# Patient Record
Sex: Male | Born: 1985 | Race: White | Hispanic: No | Marital: Married
Health system: Southern US, Community
[De-identification: ages and names within clinical notes are randomized; demographics above are authoritative.]

---

## 2009-04-22 DIAGNOSIS — K589 Irritable bowel syndrome without diarrhea: Secondary | ICD-10-CM | POA: Insufficient documentation

## 2009-04-22 DIAGNOSIS — S52539A Colles' fracture of unspecified radius, initial encounter for closed fracture: Secondary | ICD-10-CM | POA: Insufficient documentation

## 2016-02-04 DIAGNOSIS — G4730 Sleep apnea, unspecified: Secondary | ICD-10-CM | POA: Insufficient documentation

## 2019-06-13 DIAGNOSIS — M9901 Segmental and somatic dysfunction of cervical region: Secondary | ICD-10-CM | POA: Diagnosis not present

## 2019-06-13 DIAGNOSIS — M9903 Segmental and somatic dysfunction of lumbar region: Secondary | ICD-10-CM | POA: Diagnosis not present

## 2019-06-13 DIAGNOSIS — M47813 Spondylosis without myelopathy or radiculopathy, cervicothoracic region: Secondary | ICD-10-CM | POA: Diagnosis not present

## 2019-06-13 DIAGNOSIS — M9904 Segmental and somatic dysfunction of sacral region: Secondary | ICD-10-CM | POA: Diagnosis not present

## 2019-06-17 DIAGNOSIS — M9901 Segmental and somatic dysfunction of cervical region: Secondary | ICD-10-CM | POA: Diagnosis not present

## 2019-06-17 DIAGNOSIS — M9904 Segmental and somatic dysfunction of sacral region: Secondary | ICD-10-CM | POA: Diagnosis not present

## 2019-06-17 DIAGNOSIS — M9903 Segmental and somatic dysfunction of lumbar region: Secondary | ICD-10-CM | POA: Diagnosis not present

## 2019-06-17 DIAGNOSIS — M47813 Spondylosis without myelopathy or radiculopathy, cervicothoracic region: Secondary | ICD-10-CM | POA: Diagnosis not present

## 2019-06-18 DIAGNOSIS — M9901 Segmental and somatic dysfunction of cervical region: Secondary | ICD-10-CM | POA: Diagnosis not present

## 2019-06-18 DIAGNOSIS — M9903 Segmental and somatic dysfunction of lumbar region: Secondary | ICD-10-CM | POA: Diagnosis not present

## 2019-06-18 DIAGNOSIS — M9904 Segmental and somatic dysfunction of sacral region: Secondary | ICD-10-CM | POA: Diagnosis not present

## 2019-06-18 DIAGNOSIS — M47813 Spondylosis without myelopathy or radiculopathy, cervicothoracic region: Secondary | ICD-10-CM | POA: Diagnosis not present

## 2019-06-19 DIAGNOSIS — M9903 Segmental and somatic dysfunction of lumbar region: Secondary | ICD-10-CM | POA: Diagnosis not present

## 2019-06-19 DIAGNOSIS — M9904 Segmental and somatic dysfunction of sacral region: Secondary | ICD-10-CM | POA: Diagnosis not present

## 2019-06-19 DIAGNOSIS — M9901 Segmental and somatic dysfunction of cervical region: Secondary | ICD-10-CM | POA: Diagnosis not present

## 2019-06-19 DIAGNOSIS — M47813 Spondylosis without myelopathy or radiculopathy, cervicothoracic region: Secondary | ICD-10-CM | POA: Diagnosis not present

## 2019-06-24 DIAGNOSIS — M9901 Segmental and somatic dysfunction of cervical region: Secondary | ICD-10-CM | POA: Diagnosis not present

## 2019-06-24 DIAGNOSIS — M9904 Segmental and somatic dysfunction of sacral region: Secondary | ICD-10-CM | POA: Diagnosis not present

## 2019-06-24 DIAGNOSIS — M47813 Spondylosis without myelopathy or radiculopathy, cervicothoracic region: Secondary | ICD-10-CM | POA: Diagnosis not present

## 2019-06-24 DIAGNOSIS — M9903 Segmental and somatic dysfunction of lumbar region: Secondary | ICD-10-CM | POA: Diagnosis not present

## 2019-06-26 DIAGNOSIS — M47813 Spondylosis without myelopathy or radiculopathy, cervicothoracic region: Secondary | ICD-10-CM | POA: Diagnosis not present

## 2019-06-26 DIAGNOSIS — M9903 Segmental and somatic dysfunction of lumbar region: Secondary | ICD-10-CM | POA: Diagnosis not present

## 2019-06-26 DIAGNOSIS — Z23 Encounter for immunization: Secondary | ICD-10-CM | POA: Diagnosis not present

## 2019-06-26 DIAGNOSIS — N451 Epididymitis: Secondary | ICD-10-CM | POA: Diagnosis not present

## 2019-06-26 DIAGNOSIS — M9901 Segmental and somatic dysfunction of cervical region: Secondary | ICD-10-CM | POA: Diagnosis not present

## 2019-06-26 DIAGNOSIS — M9904 Segmental and somatic dysfunction of sacral region: Secondary | ICD-10-CM | POA: Diagnosis not present

## 2019-06-27 DIAGNOSIS — M47813 Spondylosis without myelopathy or radiculopathy, cervicothoracic region: Secondary | ICD-10-CM | POA: Diagnosis not present

## 2019-06-27 DIAGNOSIS — M9901 Segmental and somatic dysfunction of cervical region: Secondary | ICD-10-CM | POA: Diagnosis not present

## 2019-06-27 DIAGNOSIS — M9904 Segmental and somatic dysfunction of sacral region: Secondary | ICD-10-CM | POA: Diagnosis not present

## 2019-06-27 DIAGNOSIS — M9903 Segmental and somatic dysfunction of lumbar region: Secondary | ICD-10-CM | POA: Diagnosis not present

## 2019-07-01 DIAGNOSIS — M9903 Segmental and somatic dysfunction of lumbar region: Secondary | ICD-10-CM | POA: Diagnosis not present

## 2019-07-01 DIAGNOSIS — M47813 Spondylosis without myelopathy or radiculopathy, cervicothoracic region: Secondary | ICD-10-CM | POA: Diagnosis not present

## 2019-07-01 DIAGNOSIS — M9901 Segmental and somatic dysfunction of cervical region: Secondary | ICD-10-CM | POA: Diagnosis not present

## 2019-07-01 DIAGNOSIS — M9904 Segmental and somatic dysfunction of sacral region: Secondary | ICD-10-CM | POA: Diagnosis not present

## 2019-07-03 DIAGNOSIS — M9904 Segmental and somatic dysfunction of sacral region: Secondary | ICD-10-CM | POA: Diagnosis not present

## 2019-07-03 DIAGNOSIS — M47813 Spondylosis without myelopathy or radiculopathy, cervicothoracic region: Secondary | ICD-10-CM | POA: Diagnosis not present

## 2019-07-03 DIAGNOSIS — M9903 Segmental and somatic dysfunction of lumbar region: Secondary | ICD-10-CM | POA: Diagnosis not present

## 2019-07-03 DIAGNOSIS — M9901 Segmental and somatic dysfunction of cervical region: Secondary | ICD-10-CM | POA: Diagnosis not present

## 2019-07-10 DIAGNOSIS — M47813 Spondylosis without myelopathy or radiculopathy, cervicothoracic region: Secondary | ICD-10-CM | POA: Diagnosis not present

## 2019-07-10 DIAGNOSIS — M9903 Segmental and somatic dysfunction of lumbar region: Secondary | ICD-10-CM | POA: Diagnosis not present

## 2019-07-10 DIAGNOSIS — M9904 Segmental and somatic dysfunction of sacral region: Secondary | ICD-10-CM | POA: Diagnosis not present

## 2019-07-10 DIAGNOSIS — M9901 Segmental and somatic dysfunction of cervical region: Secondary | ICD-10-CM | POA: Diagnosis not present

## 2019-07-16 DIAGNOSIS — M9904 Segmental and somatic dysfunction of sacral region: Secondary | ICD-10-CM | POA: Diagnosis not present

## 2019-07-16 DIAGNOSIS — M9903 Segmental and somatic dysfunction of lumbar region: Secondary | ICD-10-CM | POA: Diagnosis not present

## 2019-07-16 DIAGNOSIS — M9901 Segmental and somatic dysfunction of cervical region: Secondary | ICD-10-CM | POA: Diagnosis not present

## 2019-07-16 DIAGNOSIS — M47813 Spondylosis without myelopathy or radiculopathy, cervicothoracic region: Secondary | ICD-10-CM | POA: Diagnosis not present

## 2019-07-23 DIAGNOSIS — M47813 Spondylosis without myelopathy or radiculopathy, cervicothoracic region: Secondary | ICD-10-CM | POA: Diagnosis not present

## 2019-07-23 DIAGNOSIS — M9904 Segmental and somatic dysfunction of sacral region: Secondary | ICD-10-CM | POA: Diagnosis not present

## 2019-07-23 DIAGNOSIS — M9901 Segmental and somatic dysfunction of cervical region: Secondary | ICD-10-CM | POA: Diagnosis not present

## 2019-07-23 DIAGNOSIS — M9903 Segmental and somatic dysfunction of lumbar region: Secondary | ICD-10-CM | POA: Diagnosis not present

## 2019-07-31 DIAGNOSIS — M9903 Segmental and somatic dysfunction of lumbar region: Secondary | ICD-10-CM | POA: Diagnosis not present

## 2019-07-31 DIAGNOSIS — M9904 Segmental and somatic dysfunction of sacral region: Secondary | ICD-10-CM | POA: Diagnosis not present

## 2019-07-31 DIAGNOSIS — M9901 Segmental and somatic dysfunction of cervical region: Secondary | ICD-10-CM | POA: Diagnosis not present

## 2019-07-31 DIAGNOSIS — M47813 Spondylosis without myelopathy or radiculopathy, cervicothoracic region: Secondary | ICD-10-CM | POA: Diagnosis not present

## 2019-08-29 DIAGNOSIS — M9904 Segmental and somatic dysfunction of sacral region: Secondary | ICD-10-CM | POA: Diagnosis not present

## 2019-08-29 DIAGNOSIS — M9903 Segmental and somatic dysfunction of lumbar region: Secondary | ICD-10-CM | POA: Diagnosis not present

## 2019-08-29 DIAGNOSIS — M47813 Spondylosis without myelopathy or radiculopathy, cervicothoracic region: Secondary | ICD-10-CM | POA: Diagnosis not present

## 2019-08-29 DIAGNOSIS — M9901 Segmental and somatic dysfunction of cervical region: Secondary | ICD-10-CM | POA: Diagnosis not present

## 2019-09-11 DIAGNOSIS — Z20828 Contact with and (suspected) exposure to other viral communicable diseases: Secondary | ICD-10-CM | POA: Diagnosis not present

## 2019-10-14 DIAGNOSIS — S8011XA Contusion of right lower leg, initial encounter: Secondary | ICD-10-CM | POA: Diagnosis not present

## 2020-01-11 DIAGNOSIS — J45998 Other asthma: Secondary | ICD-10-CM | POA: Diagnosis not present

## 2020-01-13 DIAGNOSIS — Z1152 Encounter for screening for COVID-19: Secondary | ICD-10-CM | POA: Diagnosis not present

## 2020-01-13 DIAGNOSIS — R0600 Dyspnea, unspecified: Secondary | ICD-10-CM | POA: Diagnosis not present

## 2020-01-13 DIAGNOSIS — J302 Other seasonal allergic rhinitis: Secondary | ICD-10-CM | POA: Diagnosis not present

## 2020-02-05 DIAGNOSIS — Z6836 Body mass index (BMI) 36.0-36.9, adult: Secondary | ICD-10-CM | POA: Diagnosis not present

## 2020-02-05 DIAGNOSIS — Z13220 Encounter for screening for lipoid disorders: Secondary | ICD-10-CM | POA: Diagnosis not present

## 2020-02-05 DIAGNOSIS — Z0000 Encounter for general adult medical examination without abnormal findings: Secondary | ICD-10-CM | POA: Diagnosis not present

## 2020-04-28 DIAGNOSIS — N50811 Right testicular pain: Secondary | ICD-10-CM | POA: Diagnosis not present

## 2020-05-07 DIAGNOSIS — R1031 Right lower quadrant pain: Secondary | ICD-10-CM | POA: Diagnosis not present

## 2020-05-07 DIAGNOSIS — I861 Scrotal varices: Secondary | ICD-10-CM | POA: Diagnosis not present

## 2020-05-07 DIAGNOSIS — N503 Cyst of epididymis: Secondary | ICD-10-CM | POA: Diagnosis not present

## 2020-05-20 DIAGNOSIS — M9903 Segmental and somatic dysfunction of lumbar region: Secondary | ICD-10-CM | POA: Diagnosis not present

## 2020-05-20 DIAGNOSIS — M9904 Segmental and somatic dysfunction of sacral region: Secondary | ICD-10-CM | POA: Diagnosis not present

## 2020-05-20 DIAGNOSIS — M9901 Segmental and somatic dysfunction of cervical region: Secondary | ICD-10-CM | POA: Diagnosis not present

## 2020-05-20 DIAGNOSIS — M47813 Spondylosis without myelopathy or radiculopathy, cervicothoracic region: Secondary | ICD-10-CM | POA: Diagnosis not present

## 2020-05-21 DIAGNOSIS — M47813 Spondylosis without myelopathy or radiculopathy, cervicothoracic region: Secondary | ICD-10-CM | POA: Diagnosis not present

## 2020-05-21 DIAGNOSIS — M9901 Segmental and somatic dysfunction of cervical region: Secondary | ICD-10-CM | POA: Diagnosis not present

## 2020-05-21 DIAGNOSIS — M9903 Segmental and somatic dysfunction of lumbar region: Secondary | ICD-10-CM | POA: Diagnosis not present

## 2020-05-21 DIAGNOSIS — M9904 Segmental and somatic dysfunction of sacral region: Secondary | ICD-10-CM | POA: Diagnosis not present

## 2020-06-23 DIAGNOSIS — J069 Acute upper respiratory infection, unspecified: Secondary | ICD-10-CM | POA: Diagnosis not present

## 2020-06-23 DIAGNOSIS — Z20822 Contact with and (suspected) exposure to covid-19: Secondary | ICD-10-CM | POA: Diagnosis not present

## 2020-08-12 DIAGNOSIS — U071 COVID-19: Secondary | ICD-10-CM | POA: Diagnosis not present

## 2020-08-14 DIAGNOSIS — U071 COVID-19: Secondary | ICD-10-CM | POA: Diagnosis not present

## 2020-08-14 DIAGNOSIS — B309 Viral conjunctivitis, unspecified: Secondary | ICD-10-CM | POA: Diagnosis not present

## 2020-08-15 DIAGNOSIS — U071 COVID-19: Secondary | ICD-10-CM | POA: Insufficient documentation

## 2020-08-17 DIAGNOSIS — U071 COVID-19: Secondary | ICD-10-CM | POA: Diagnosis not present

## 2020-08-17 DIAGNOSIS — Z23 Encounter for immunization: Secondary | ICD-10-CM | POA: Diagnosis not present

## 2020-12-22 DIAGNOSIS — Z6835 Body mass index (BMI) 35.0-35.9, adult: Secondary | ICD-10-CM | POA: Diagnosis not present

## 2020-12-22 DIAGNOSIS — Z0000 Encounter for general adult medical examination without abnormal findings: Secondary | ICD-10-CM | POA: Diagnosis not present

## 2020-12-22 DIAGNOSIS — G4733 Obstructive sleep apnea (adult) (pediatric): Secondary | ICD-10-CM | POA: Diagnosis not present

## 2020-12-22 DIAGNOSIS — E785 Hyperlipidemia, unspecified: Secondary | ICD-10-CM | POA: Diagnosis not present

## 2021-01-20 DIAGNOSIS — G4733 Obstructive sleep apnea (adult) (pediatric): Secondary | ICD-10-CM | POA: Diagnosis not present

## 2021-01-20 DIAGNOSIS — Z9989 Dependence on other enabling machines and devices: Secondary | ICD-10-CM | POA: Diagnosis not present

## 2021-01-20 DIAGNOSIS — G4710 Hypersomnia, unspecified: Secondary | ICD-10-CM | POA: Diagnosis not present

## 2021-01-20 DIAGNOSIS — R0683 Snoring: Secondary | ICD-10-CM | POA: Diagnosis not present

## 2021-03-23 DIAGNOSIS — H5711 Ocular pain, right eye: Secondary | ICD-10-CM | POA: Diagnosis not present

## 2021-03-23 DIAGNOSIS — H47323 Drusen of optic disc, bilateral: Secondary | ICD-10-CM | POA: Diagnosis not present

## 2021-03-23 DIAGNOSIS — H0102B Squamous blepharitis left eye, upper and lower eyelids: Secondary | ICD-10-CM | POA: Diagnosis not present

## 2021-03-23 DIAGNOSIS — H0102A Squamous blepharitis right eye, upper and lower eyelids: Secondary | ICD-10-CM | POA: Diagnosis not present

## 2021-05-13 DIAGNOSIS — G4733 Obstructive sleep apnea (adult) (pediatric): Secondary | ICD-10-CM | POA: Diagnosis not present

## 2021-05-25 ENCOUNTER — Ambulatory Visit: Payer: BC Managed Care – PPO | Admitting: Sports Medicine

## 2021-05-25 ENCOUNTER — Other Ambulatory Visit: Payer: Self-pay

## 2021-05-25 ENCOUNTER — Encounter: Payer: Self-pay | Admitting: Sports Medicine

## 2021-05-25 ENCOUNTER — Ambulatory Visit (INDEPENDENT_AMBULATORY_CARE_PROVIDER_SITE_OTHER): Payer: BC Managed Care – PPO

## 2021-05-25 ENCOUNTER — Telehealth: Payer: Self-pay | Admitting: Sports Medicine

## 2021-05-25 DIAGNOSIS — M1711 Unilateral primary osteoarthritis, right knee: Secondary | ICD-10-CM | POA: Diagnosis not present

## 2021-05-25 DIAGNOSIS — M1712 Unilateral primary osteoarthritis, left knee: Secondary | ICD-10-CM | POA: Diagnosis not present

## 2021-05-25 DIAGNOSIS — M25561 Pain in right knee: Secondary | ICD-10-CM

## 2021-05-25 DIAGNOSIS — M25562 Pain in left knee: Secondary | ICD-10-CM | POA: Diagnosis not present

## 2021-05-25 MED ORDER — NAPROXEN 500 MG PO TABS: 500.0000 mg | ORAL_TABLET | Freq: Two times a day (BID) | ORAL | 3 refills | Status: AC

## 2021-05-25 NOTE — Assessment & Plan Note (Signed)
Brandon Newman returns, he is a pleasant 35 year old male, he has had a long history of problems with his right knee, he had a meniscal repair later in high school, this was followed by a partial meniscectomy somewhat later after a retear. Since then he said increasing pain medial joint line, he did have what sounds to be a Synvisc 1 injection with an outside orthopedic group that seem to work for several years. Historically steroid injections have not provided significant relief. I think we should do the tried and true method, we will switch him to naproxen 500 twice daily, get some updated x-rays, and I will get him approved for viscosupplementation, ideally Orthovisc but we can do Synvisc if that is what his insurance company would like. Return to see me to start the injection series.

## 2021-05-25 NOTE — Telephone Encounter (Signed)
Brandon Newman has right knee osteoarthritis, x-ray confirmed, he did well with the Synvisc 1 about 2 years ago, recurrence of pain, failed NSAIDs, steroid injections have not worked, lets get approval again for Orthovisc or Synvisc whichever one his insurance company would like to pay for.

## 2021-05-25 NOTE — Progress Notes (Signed)
    Procedures performed today:    None.  Independent interpretation of notes and tests performed by another provider:   None.  Brief History, Exam, Impression, and Recommendations:    Primary osteoarthritis of right knee Brandon Newman returns, he is a pleasant 35 year old male, he has had a long history of problems with his right knee, he had a meniscal repair later in high school, this was followed by a partial meniscectomy somewhat later after a retear. Since then he said increasing pain medial joint line, he did have what sounds to be a Synvisc 1 injection with an outside orthopedic group that seem to work for several years. Historically steroid injections have not provided significant relief. I think we should do the tried and true method, we will switch him to naproxen 500 twice daily, get some updated x-rays, and I will get him approved for viscosupplementation, ideally Orthovisc but we can do Synvisc if that is what his insurance company would like. Return to see me to start the injection series.  Chronic pain with exacerbation and pharmacologic intervention.  ___________________________________________ Ihor Austin. Benjamin Stain, M.D., ABFM., CAQSM. Primary Care and Sports Medicine [facility]  Adjunct Instructor of Family Medicine  [employer]

## 2021-06-11 NOTE — Telephone Encounter (Signed)
PA information submitted via [employer] Paperwork has been printed and given to Dr. T for signatures. Once obtained, information will be faxed to [employer] at [phone]  

## 2021-06-12 DIAGNOSIS — G4733 Obstructive sleep apnea (adult) (pediatric): Secondary | ICD-10-CM | POA: Diagnosis not present

## 2021-06-16 NOTE — Telephone Encounter (Signed)
[employer] paperwork signed by Dr. T and faxed to [employer] at [phone] Fax confirmation receipt received 

## 2021-06-23 NOTE — Telephone Encounter (Addendum)
Benefits Investigation Details received from [place] with [employer] [place] at [place] to make sure I was understanding the details appropriately  PA submitted to [facility] via [place] is [place] [phone]) PA pending  Pt has met $311.39 of his $3500 deductible Once PA has been obtained, he will pay completely out-of-pocket for injections. The $35 copay will not apply.

## 2021-06-30 NOTE — Telephone Encounter (Signed)
Spoke with [place] with [employer] and was told the PA is still pending. She said that a determination is supposed to be done by 07/01/21.

## 2021-07-01 NOTE — Telephone Encounter (Signed)
Offer him PRP injections, they are $475 each, we draw his own blood for the procedure, generally we will do 2 or 3.  He would need to be off of anti-inflammatories for a week prior to PRP injections.

## 2021-07-01 NOTE — Telephone Encounter (Signed)
PA was denied by [employer]:  "This medication is approved when the member uses fewer pain medications (such as ibuprofen, naproxen, acetaminophen, etc) or a lower dose of pain medications since the last knee injection. Also, this medication is approved when the member has less pain and has significant improvement in activities of daily living since the last knee injection. Medical records are required to show the member is using less pain medication and to show the member has had improvement. In this case, the member has not used less pain medications or a lower dose of pain medication and the member has not had significant improvement in pain or activities since the last knee injection"  Would you like to proceed with an appeal or refer the pt to the [employer] Direct Access discount program?

## 2021-07-06 NOTE — Telephone Encounter (Signed)
Patient aware of recommendation via voicemail. Instructed patient to call back if there are any questions or concerns.

## 2021-07-06 NOTE — Telephone Encounter (Signed)
At this point I think continue current treatment plan.

## 2021-07-06 NOTE — Telephone Encounter (Signed)
Spoke with pt and he said that right now he is not able to afford doing PRP injections. Are there any other options, such as the [employer] Access discount program, or should the pt continue with the current treatment regimen?

## 2021-07-13 DIAGNOSIS — G4733 Obstructive sleep apnea (adult) (pediatric): Secondary | ICD-10-CM | POA: Diagnosis not present

## 2021-07-16 NOTE — Telephone Encounter (Signed)
Pt called back and said that he spoke with his insurance company to find out the reason for the denial. He said that according to what they told him, we answered some of the questions wrong, the ones regarding decreasing use of pain meds and whether or not he had improvement after previous injections. I told him that I filled out all of the answers according to the information I had in his chart and submitted office notes for them to review. He requested the PA to be re-submitted. I told him I would be happy to do that for him, however that will not change the payment requirements for the injections.   Info and medical records faxed to [facility] of  for review.

## 2021-08-20 NOTE — Telephone Encounter (Signed)
Thank you, I made some changes to the letter and it should be available in epic to send off, my signature is on it as well.

## 2021-08-20 NOTE — Telephone Encounter (Signed)
Pt called checking on the status of the Synvisc injections. I called [employer] and they stated they did not receive the faxed medical records I sent even though I got a fax confirmation back. Inetta Fermo with [employer] said that what needed to be done at this point is send in a PCR request. This would include a letter stating that the patient has had some improvement with the injections and that he does not have to take as much pain meds/NSAID's for pain control. She said that the original submission did not show that there was improvement and decrease in pain meds so this needs to be stated in the letter. Once the letter is complete it has to be faxed along with medical records to them at [phone]. Letter has been drafted and sent to Dr. Karie Schwalbe for review and signing.

## 2021-08-23 NOTE — Telephone Encounter (Signed)
Letter, office notes, and imaging reports printed and faxed to [facility]. Fax confirmation received. Pt aware of status via voicemail.

## 2021-09-21 NOTE — Telephone Encounter (Signed)
Pt has [employer] and as of 09/12/2021, they now cover Orthovisc.  [employer] paperwork faxed to [employer] at [phone] Request is for Orthovisc Pt's insurance prefers Orthovisc Fax confirmation receipt received   Pt aware of PA status. Per Dr. Karie Schwalbe, once PA has been started we can do buy and bill for Orthovisc.

## 2021-09-22 NOTE — Telephone Encounter (Signed)
Spoke with Dr. T and he instructed me to go ahead and schedule the pt for buy and bill. Pt aware and call transferred to front desk for scheduling. Will continue to get PA process done °

## 2021-09-27 ENCOUNTER — Other Ambulatory Visit: Payer: Self-pay

## 2021-09-27 ENCOUNTER — Ambulatory Visit: Payer: BC Managed Care – PPO | Admitting: Medical-Surgical

## 2021-09-27 ENCOUNTER — Ambulatory Visit: Payer: BC Managed Care – PPO | Admitting: Sports Medicine

## 2021-09-27 ENCOUNTER — Ambulatory Visit (INDEPENDENT_AMBULATORY_CARE_PROVIDER_SITE_OTHER): Payer: BC Managed Care – PPO

## 2021-09-27 DIAGNOSIS — M1711 Unilateral primary osteoarthritis, right knee: Secondary | ICD-10-CM | POA: Diagnosis not present

## 2021-09-27 NOTE — Assessment & Plan Note (Addendum)
Brandon Newman returns, pleasant 36 year old male, long history of right knee pain, history of meniscal repair followed by partial meniscectomy somewhat later. Had Synvisc 1 in the past at an outside clinic that worked well, we recently got him approved for Orthovisc, starting #1 today. Return in 1 week for Orthovisc No. 2 of 4 right knee.

## 2021-09-27 NOTE — Progress Notes (Signed)
° ° °  Procedures performed today:    Procedure: Real-time Ultrasound Guided aspiration/injection of the right knee Device: Samsung HS60  Verbal informed consent obtained.  Time-out conducted.  Noted no overlying erythema, induration, or other signs of local infection.  Skin prepped in a sterile fashion.  Local anesthesia: Topical Ethyl chloride.  With sterile technique and under real time ultrasound guidance: Noted trace effusion, aspirated 35 mL of clear, straw-colored fluid, syringe switched and 30 mg/2 mL of OrthoVisc (sodium hyaluronate) in a prefilled syringe was injected easily into the knee through a 22-gauge needle.   Completed without difficulty  Advised to call if fevers/chills, erythema, induration, drainage, or persistent bleeding.  Images permanently stored and available for review in PACS.  Impression: Technically successful ultrasound guided aspiration/injection.  Independent interpretation of notes and tests performed by another provider:   None.  Brief History, Exam, Impression, and Recommendations:    Primary osteoarthritis of right knee Brandon Newman returns, pleasant 36 year old male, long history of right knee pain, history of meniscal repair followed by partial meniscectomy somewhat later. Had Synvisc 1 in the past at an outside clinic that worked well, we recently got him approved for Orthovisc, starting #1 today. Return in 1 week for Orthovisc No. 2 of 4 right knee.    ___________________________________________ Ihor Austin. Benjamin Stain, M.D., ABFM., CAQSM. Primary Care and Sports Medicine [facility]  Adjunct Instructor of Family Medicine  [employer]

## 2021-10-04 ENCOUNTER — Ambulatory Visit: Payer: BC Managed Care – PPO | Admitting: Sports Medicine

## 2021-10-04 ENCOUNTER — Other Ambulatory Visit: Payer: Self-pay

## 2021-10-04 ENCOUNTER — Ambulatory Visit (INDEPENDENT_AMBULATORY_CARE_PROVIDER_SITE_OTHER): Payer: BC Managed Care – PPO

## 2021-10-04 DIAGNOSIS — M1711 Unilateral primary osteoarthritis, right knee: Secondary | ICD-10-CM | POA: Diagnosis not present

## 2021-10-04 NOTE — Progress Notes (Signed)
° ° °  Procedures performed today:    Procedure: Real-time Ultrasound Guided aspiration/injection of the right knee Device: Samsung HS60  Verbal informed consent obtained.  Time-out conducted.  Noted no overlying erythema, induration, or other signs of local infection.  Skin prepped in a sterile fashion.  Local anesthesia: Topical Ethyl chloride.  With sterile technique and under real time ultrasound guidance: Noted trace effusion, aspirated 20 mL of clear, straw-colored fluid, syringe switched and 30 mg/2 mL of OrthoVisc (sodium hyaluronate) in a prefilled syringe was injected easily into the knee through a 22-gauge needle.   Completed without difficulty  Advised to call if fevers/chills, erythema, induration, drainage, or persistent bleeding.  Images permanently stored and available for review in PACS.  Impression: Technically successful ultrasound guided aspiration/injection.  Independent interpretation of notes and tests performed by another provider:   None.  Brief History, Exam, Impression, and Recommendations:    Primary osteoarthritis of right knee Brandon Newman returns, he is here for Orthovisc No. 2 of 4. Return in 1 week for #3 of 4 right knee.    ___________________________________________ Ihor Austin. Benjamin Stain, M.D., ABFM., CAQSM. Primary Care and Sports Medicine [facility]  Adjunct Instructor of Family Medicine  [employer]

## 2021-10-04 NOTE — Assessment & Plan Note (Signed)
Josemiguel returns, he is here for Orthovisc No. 2 of 4. Return in 1 week for #3 of 4 right knee.

## 2021-10-11 ENCOUNTER — Ambulatory Visit (INDEPENDENT_AMBULATORY_CARE_PROVIDER_SITE_OTHER): Payer: BC Managed Care – PPO

## 2021-10-11 ENCOUNTER — Ambulatory Visit: Payer: BC Managed Care – PPO | Admitting: Sports Medicine

## 2021-10-11 ENCOUNTER — Other Ambulatory Visit: Payer: Self-pay

## 2021-10-11 DIAGNOSIS — M1711 Unilateral primary osteoarthritis, right knee: Secondary | ICD-10-CM | POA: Diagnosis not present

## 2021-10-11 NOTE — Assessment & Plan Note (Signed)
Orthovisc 3 of 4 today, return in 1 week for Orthovisc No. 4 of 4 right knee. Starting to feel a little bit better.

## 2021-10-11 NOTE — Progress Notes (Signed)
° ° °  Procedures performed today:    Procedure: Real-time Ultrasound Guided injection of the right knee Device: Samsung HS60  Verbal informed consent obtained.  Time-out conducted.  Noted no overlying erythema, induration, or other signs of local infection.  Skin prepped in a sterile fashion.  Local anesthesia: Topical Ethyl chloride.  With sterile technique and under real time ultrasound guidance: Trace effusion noted, 30 mg/2 mL of OrthoVisc (sodium hyaluronate) in a prefilled syringe was injected easily into the knee through a 22-gauge needle. Completed without difficulty  Advised to call if fevers/chills, erythema, induration, drainage, or persistent bleeding.  Images permanently stored and available for review in PACS.  Impression: Technically successful ultrasound guided injection.  Independent interpretation of notes and tests performed by another provider:   None.  Brief History, Exam, Impression, and Recommendations:    Primary osteoarthritis of right knee Orthovisc 3 of 4 today, return in 1 week for Orthovisc No. 4 of 4 right knee. Starting to feel a little bit better.    ___________________________________________ Gwen Her. Dianah Field, M.D., ABFM., CAQSM. Primary Care and [place] Instructor of [place] [employer] [facility] of Medicine

## 2021-10-13 IMAGING — DX DG KNEE COMPLETE 4+V*R*
4 series · 4 of 4 positions shown · non-contrast
Comparison: None.

CLINICAL DATA: Bilateral knee pain with multiple surgery

EXAM:
RIGHT KNEE - COMPLETE 4+ VIEW

[knee tunnel]
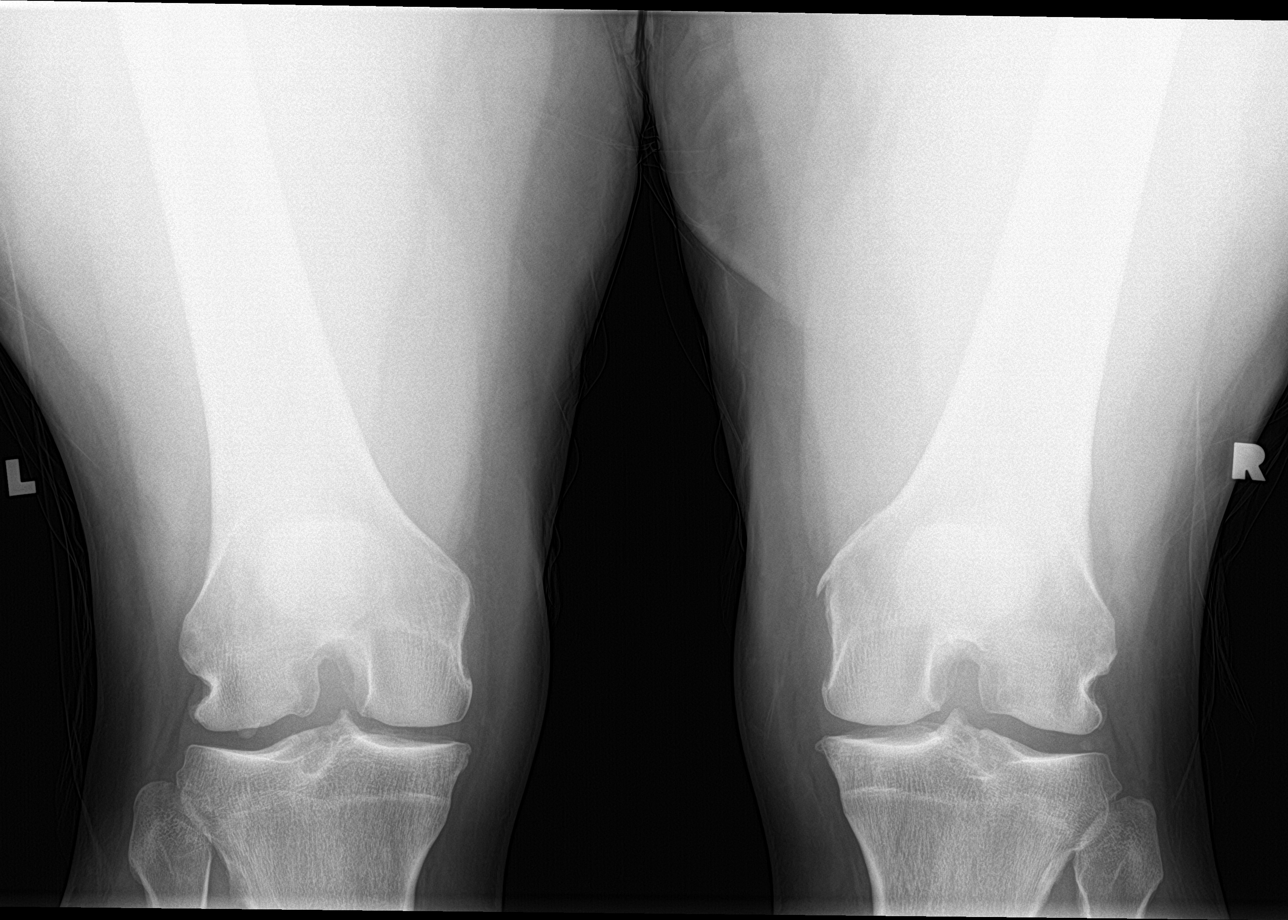

[knee sunrise]
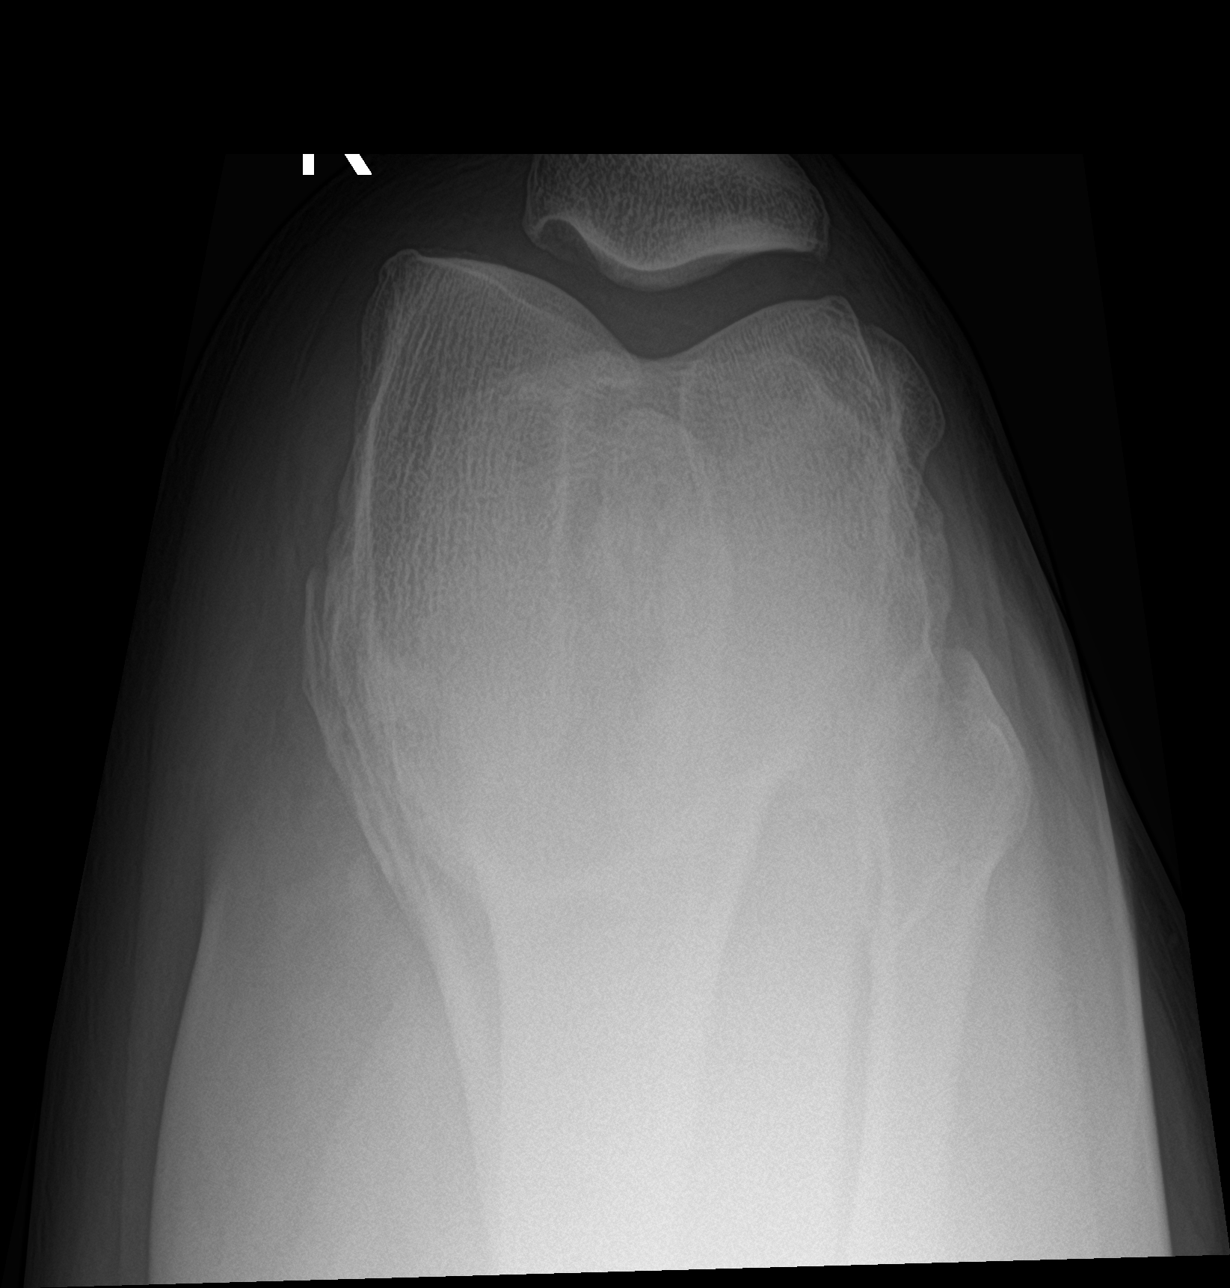

[knee ap bilat standing]
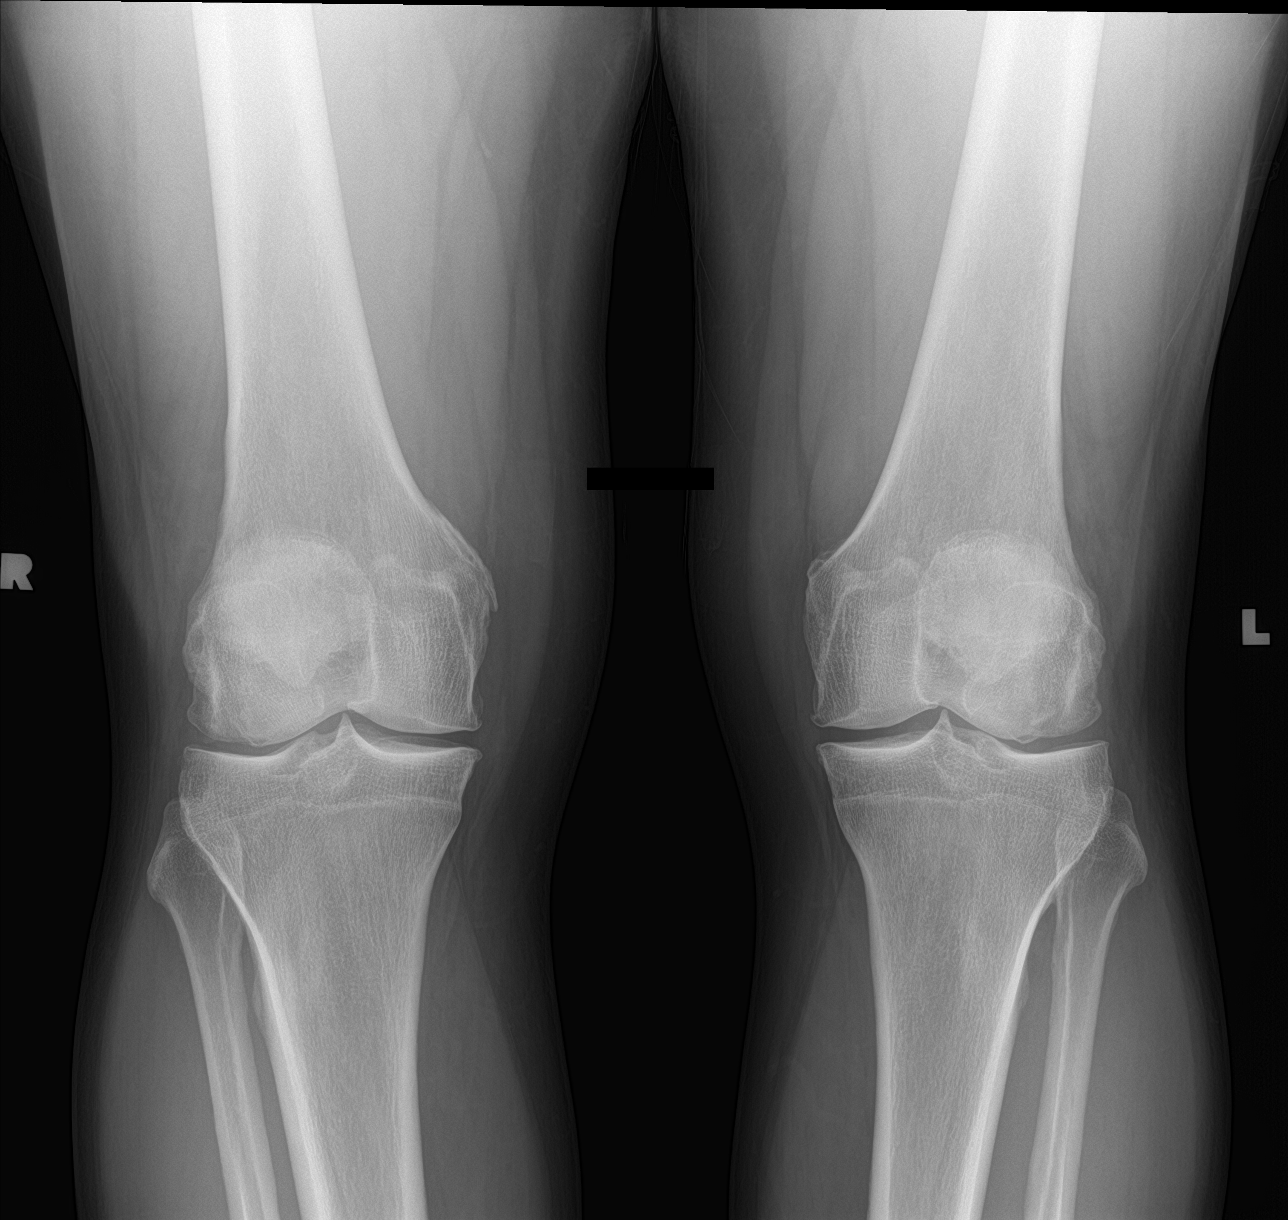

[knee lat]
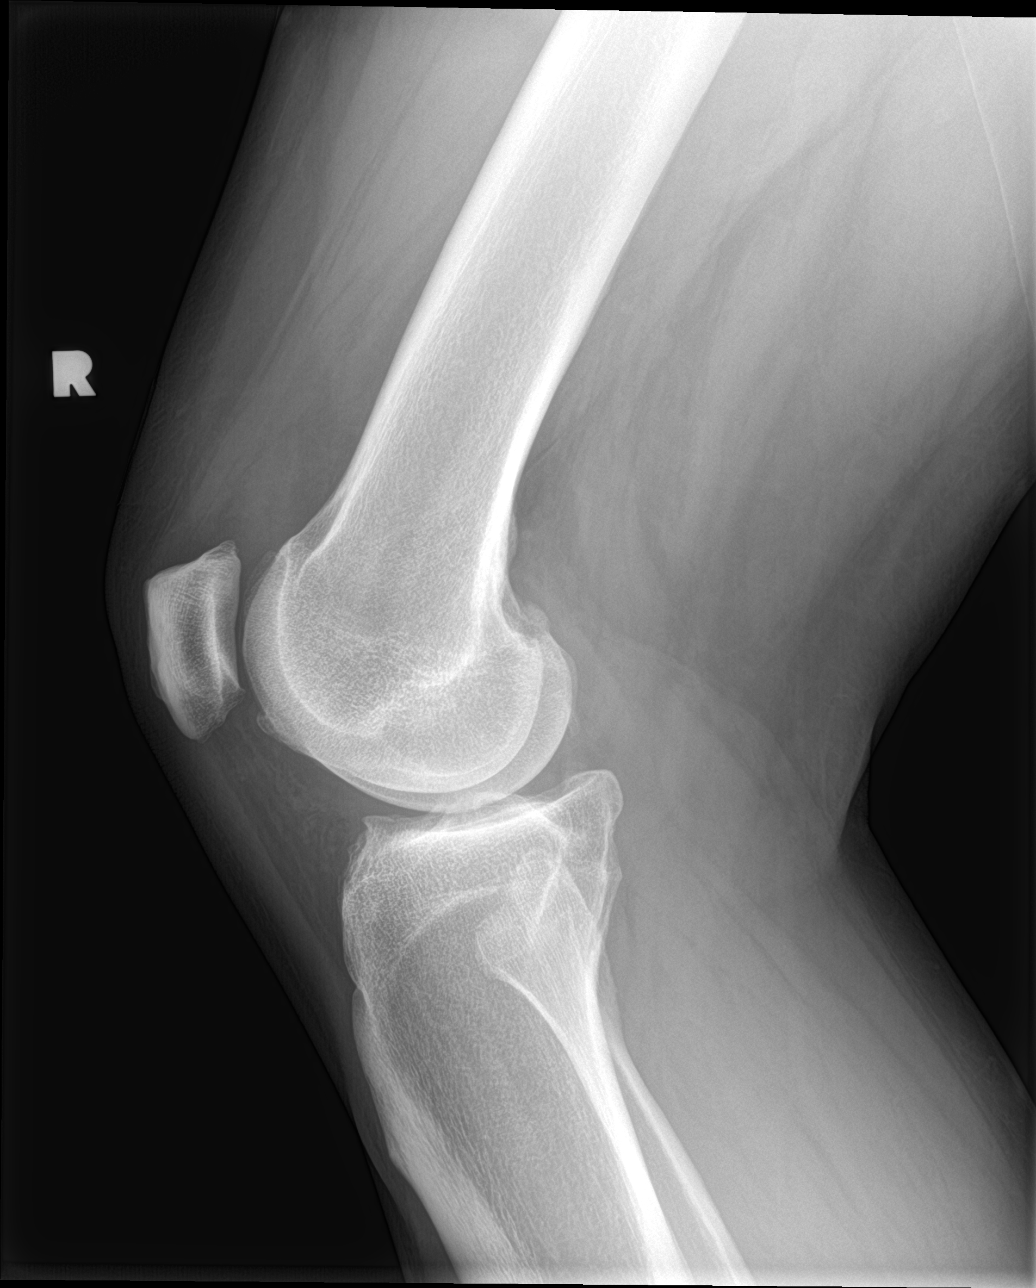

[4 of 4 positions shown; findings below may reference images not displayed]

FINDINGS: No fracture or malalignment. Mild patellofemoral and medial joint
space degenerative change. Small knee effusion
IMPRESSION: Mild degenerative change with small effusion

## 2021-10-13 IMAGING — DX DG KNEE 1-2V*L*
2 series · 2 of 2 positions shown · non-contrast
Comparison: None.

CLINICAL DATA: Knee pain

EXAM:
LEFT KNEE - 1-2 VIEW

[knee tunnel]
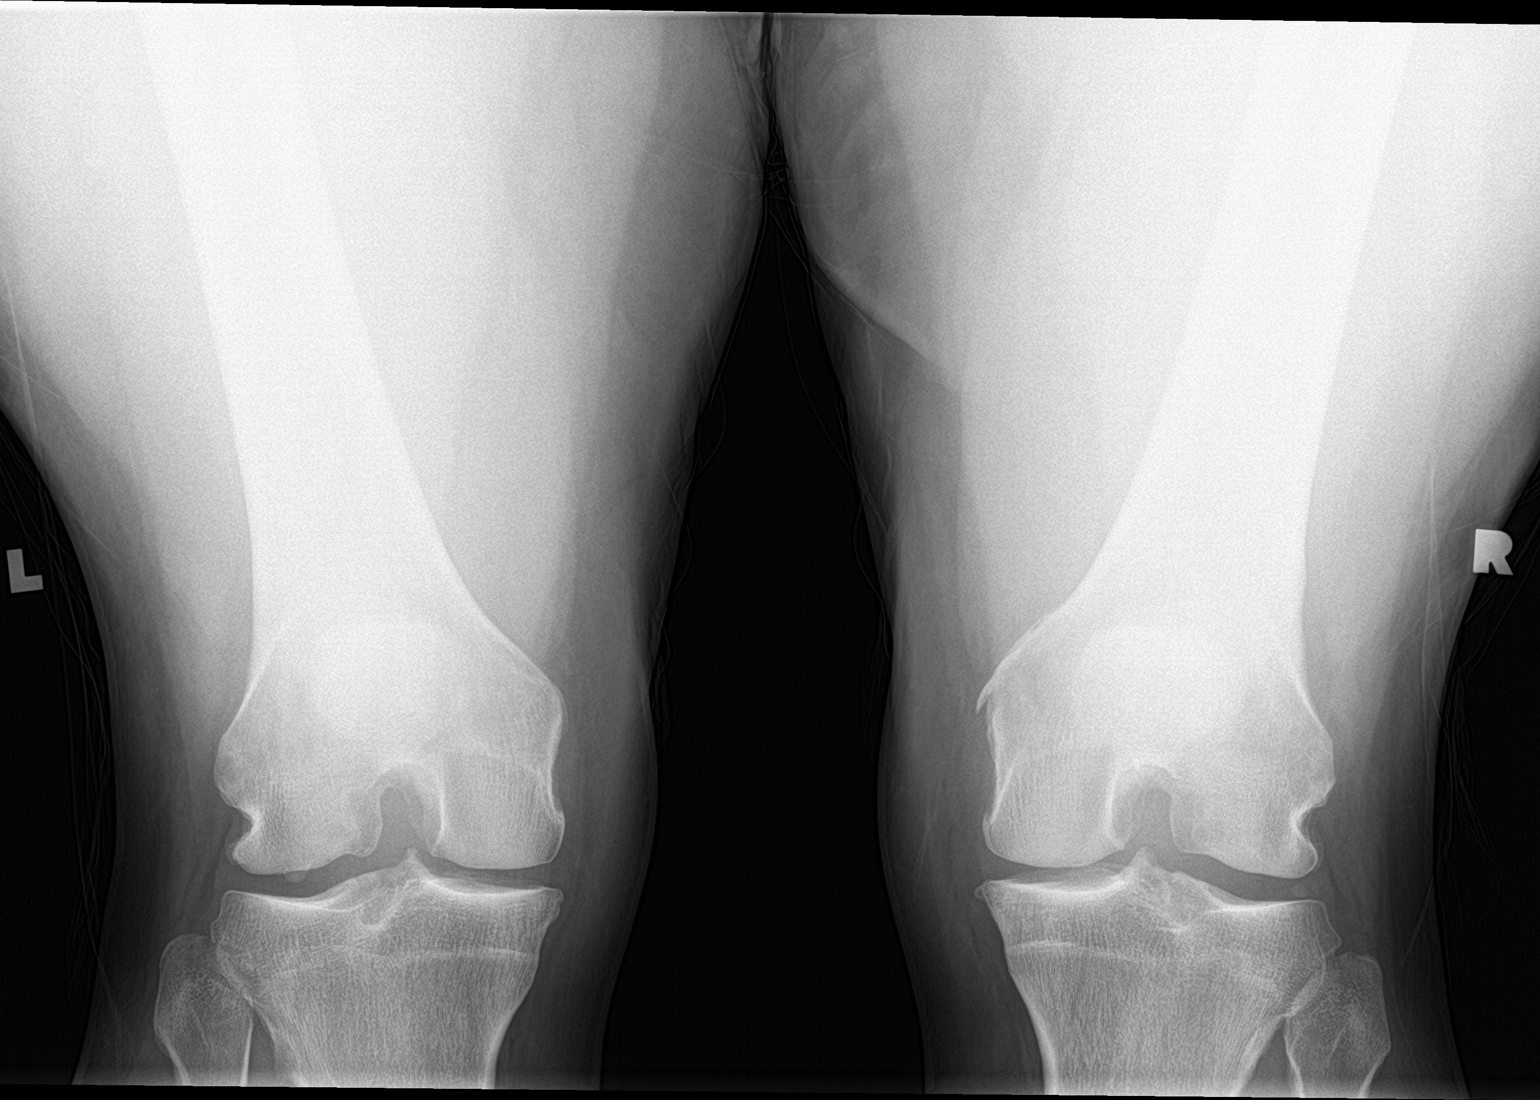

[knee ap bilat standing]
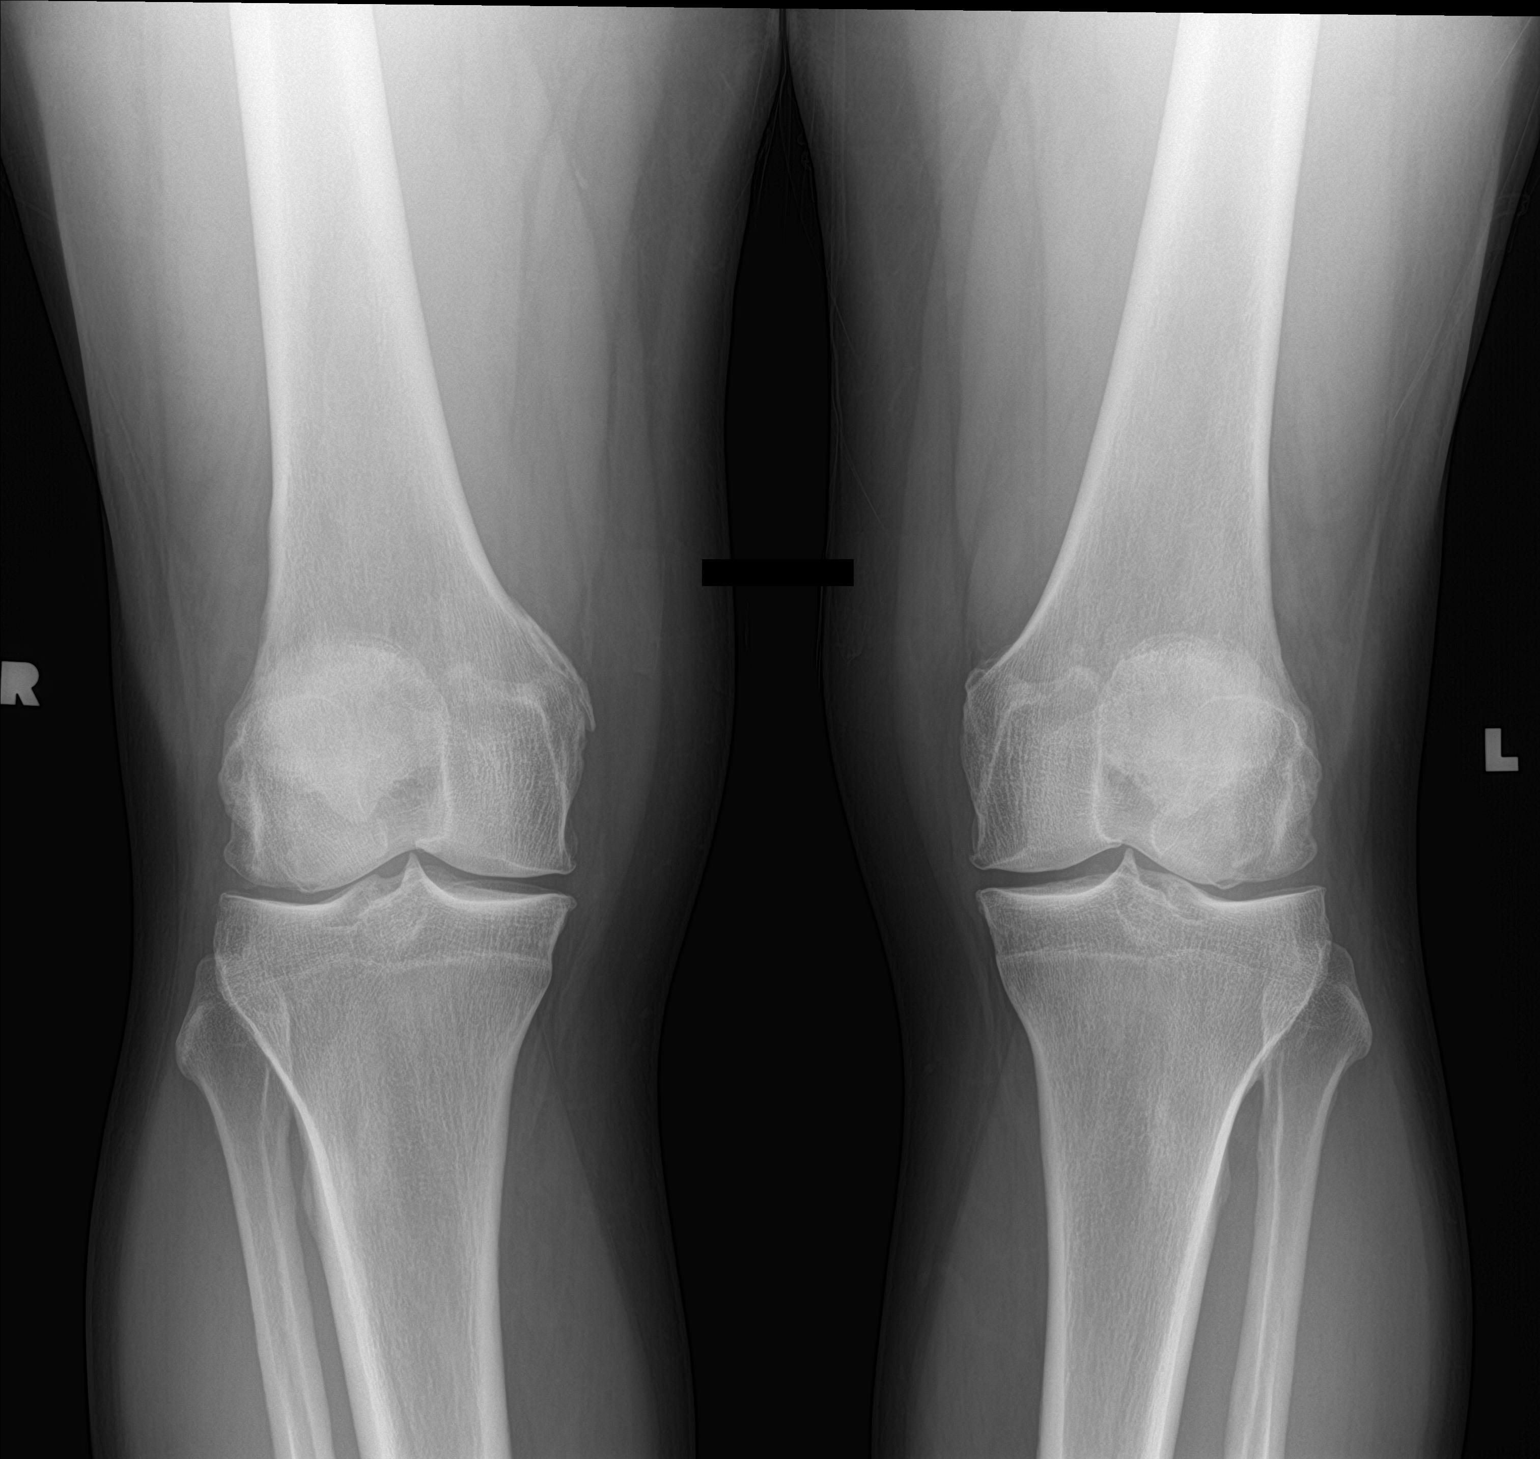

[2 of 2 positions shown; findings below may reference images not displayed]

FINDINGS: No fracture or malalignment. Mild medial and lateral joint space
degenerative spurring.
IMPRESSION: Mild degenerative changes

## 2021-10-18 ENCOUNTER — Ambulatory Visit: Payer: BC Managed Care – PPO | Admitting: Sports Medicine

## 2021-10-18 ENCOUNTER — Ambulatory Visit (INDEPENDENT_AMBULATORY_CARE_PROVIDER_SITE_OTHER): Payer: BC Managed Care – PPO

## 2021-10-18 ENCOUNTER — Other Ambulatory Visit: Payer: Self-pay

## 2021-10-18 DIAGNOSIS — M1711 Unilateral primary osteoarthritis, right knee: Secondary | ICD-10-CM | POA: Diagnosis not present

## 2021-10-18 NOTE — Assessment & Plan Note (Addendum)
Orthovisc No. 4 of 4 right knee, really doing a lot better, return to see me in 1 month as needed.

## 2021-10-18 NOTE — Progress Notes (Addendum)
° ° °  Procedures performed today:    Procedure: Real-time Ultrasound Guided injection of the right knee Device: Samsung HS60  Verbal informed consent obtained.  Time-out conducted.  Noted no overlying erythema, induration, or other signs of local infection.  Skin prepped in a sterile fashion.  Local anesthesia: Topical Ethyl chloride.  With sterile technique and under real time ultrasound guidance: Noted trace effusion, 30 mg/2 mL of OrthoVisc (sodium hyaluronate) in a prefilled syringe was injected easily into the knee through a 22-gauge needle. Completed without difficulty  Advised to call if fevers/chills, erythema, induration, drainage, or persistent bleeding.  Images permanently stored and available for review in PACS.  Impression: Technically successful ultrasound guided injection.  Independent interpretation of notes and tests performed by another provider:   None.  Brief History, Exam, Impression, and Recommendations:    Primary osteoarthritis of right knee Orthovisc No. 4 of 4 right knee, really doing a lot better, return to see me in 1 month as needed.    ___________________________________________ Ihor Austin. Benjamin Stain, M.D., ABFM., CAQSM. Primary Care and Sports Medicine [facility]  Adjunct Instructor of Family Medicine  [employer]

## 2021-10-28 DIAGNOSIS — H10011 Acute follicular conjunctivitis, right eye: Secondary | ICD-10-CM | POA: Diagnosis not present

## 2021-11-08 ENCOUNTER — Encounter: Payer: Self-pay | Admitting: Sports Medicine

## 2021-11-08 NOTE — Telephone Encounter (Signed)
We finished Orthovisc in the right knee, please work on getting it approved for the left.

## 2021-11-09 ENCOUNTER — Telehealth: Payer: Self-pay

## 2021-11-09 NOTE — Telephone Encounter (Signed)
Per Dr. Karie Schwalbe:  We finished Orthovisc in the right knee, please work on getting it approved for the left.

## 2021-11-09 NOTE — Telephone Encounter (Signed)
[employer] paperwork faxed to [employer] at [phone] Request is for Orthovisc Pt's insurance prefers Orthovisc Fax confirmation receipt received  

## 2021-11-09 NOTE — Telephone Encounter (Signed)
Benefits Investigation Details received from [employer] Injection: Orthovisc  Medical: Deductible does not apply. Once the OOP has been met, patient is covered at 100%. Prior authorization is required for the drug through [employer] [place]. To initiate, fax the provided form to [phone] PA required: Yes  PA form faxed to [facility] at [phone]  Fax confirmation received  Pharmacy: The product is not covered under the pharmacy plan.   Specialty Pharmacy: [facility]  May fill through: [place] Copay/Coinsurance:  Product Copay: $35 Administration Coinsurance:  Administration Copay:  Deductible:  Out of Pocket Max: $3500 (met: $526.31)    Awaiting response from PA that was submitted to [facility].

## 2021-11-11 NOTE — Telephone Encounter (Signed)
PA determination received from [employer] good from 11/09/21 through 05/07/22 ?PA has been approved for Orthovisc ? ?Left voicemail for patient that injections have been approved. He can call to schedule at his convenience.  ?

## 2021-11-15 ENCOUNTER — Ambulatory Visit: Payer: BC Managed Care – PPO | Admitting: Sports Medicine

## 2021-11-18 NOTE — Telephone Encounter (Signed)
Patient scheduled for 11/24/21.  ?

## 2021-11-24 ENCOUNTER — Other Ambulatory Visit: Payer: Self-pay

## 2021-11-24 ENCOUNTER — Ambulatory Visit (INDEPENDENT_AMBULATORY_CARE_PROVIDER_SITE_OTHER): Payer: BC Managed Care – PPO

## 2021-11-24 ENCOUNTER — Ambulatory Visit: Payer: BC Managed Care – PPO | Admitting: Sports Medicine

## 2021-11-24 DIAGNOSIS — M1711 Unilateral primary osteoarthritis, right knee: Secondary | ICD-10-CM

## 2021-11-24 DIAGNOSIS — G8929 Other chronic pain: Secondary | ICD-10-CM

## 2021-11-24 DIAGNOSIS — M25562 Pain in left knee: Secondary | ICD-10-CM | POA: Diagnosis not present

## 2021-11-24 DIAGNOSIS — M1712 Unilateral primary osteoarthritis, left knee: Secondary | ICD-10-CM

## 2021-11-24 NOTE — Assessment & Plan Note (Signed)
Orthovisc 1 of 4 left knee, return in 1 week for #2 of 4. 

## 2021-11-24 NOTE — Progress Notes (Signed)
? ? ?  Procedures performed today:   ? ?Procedure: Real-time Ultrasound Guided injection of the left knee ?Device: Samsung HS60  ?Verbal informed consent obtained.  ?Time-out conducted.  ?Noted no overlying erythema, induration, or other signs of local infection.  ?Skin prepped in a sterile fashion.  ?Local anesthesia: Topical Ethyl chloride.  ?With sterile technique and under real time ultrasound guidance: Noted trace effusion, 30 mg/2 mL of OrthoVisc (sodium hyaluronate) in a prefilled syringe was injected easily into the knee through a 22-gauge needle. ?Completed without difficulty  ?Advised to call if fevers/chills, erythema, induration, drainage, or persistent bleeding.  ?Images permanently stored and available for review in PACS.  ?Impression: Technically successful ultrasound guided injection. ? ?Independent interpretation of notes and tests performed by another provider:  ? ?None. ? ?Brief History, Exam, Impression, and Recommendations:   ? ?Primary osteoarthritis of left knee ?Orthovisc 1 of 4 left knee, return in 1 week for #2 of 4. ? ? ? ?___________________________________________ ?Ihor Austin. Benjamin Stain, M.D., ABFM., CAQSM. ?Primary Care and Sports Medicine ?[facility] ? ?Adjunct Instructor of Family Medicine  ?[employer] ?

## 2021-12-01 ENCOUNTER — Ambulatory Visit: Payer: BC Managed Care – PPO | Admitting: Sports Medicine

## 2021-12-01 ENCOUNTER — Ambulatory Visit (INDEPENDENT_AMBULATORY_CARE_PROVIDER_SITE_OTHER): Payer: BC Managed Care – PPO

## 2021-12-01 ENCOUNTER — Other Ambulatory Visit: Payer: Self-pay

## 2021-12-01 DIAGNOSIS — M1712 Unilateral primary osteoarthritis, left knee: Secondary | ICD-10-CM

## 2021-12-01 NOTE — Progress Notes (Signed)
? ? ?  Procedures performed today:   ? ?Procedure: Real-time Ultrasound Guided injection of the left knee ?Device: Samsung HS60  ?Verbal informed consent obtained.  ?Time-out conducted.  ?Noted no overlying erythema, induration, or other signs of local infection.  ?Skin prepped in a sterile fashion.  ?Local anesthesia: Topical Ethyl chloride.  ?With sterile technique and under real time ultrasound guidance: Noted trace effusion, 30 mg/2 mL of OrthoVisc (sodium hyaluronate) in a prefilled syringe was injected easily into the knee through a 22-gauge needle. ?Completed without difficulty  ?Advised to call if fevers/chills, erythema, induration, drainage, or persistent bleeding.  ?Images permanently stored and available for review in PACS.  ?Impression: Technically successful ultrasound guided injection. ? ?Independent interpretation of notes and tests performed by another provider:  ? ?None. ? ?Brief History, Exam, Impression, and Recommendations:   ? ?Primary osteoarthritis of left knee ?Orthovisc 2 of 4 left knee, return in 1 week for #3 of 4. ? ? ? ?___________________________________________ ?Ihor Austin. Benjamin Stain, M.D., ABFM., CAQSM. ?Primary Care and Sports Medicine ?[facility] ? ?Adjunct Instructor of Family Medicine  ?[employer] ?

## 2021-12-01 NOTE — Assessment & Plan Note (Signed)
Orthovisc 2 of 4 left knee, return in 1 week for #3 of 4 

## 2021-12-08 ENCOUNTER — Ambulatory Visit (INDEPENDENT_AMBULATORY_CARE_PROVIDER_SITE_OTHER): Payer: BC Managed Care – PPO

## 2021-12-08 ENCOUNTER — Ambulatory Visit: Payer: BC Managed Care – PPO | Admitting: Sports Medicine

## 2021-12-08 ENCOUNTER — Other Ambulatory Visit: Payer: Self-pay

## 2021-12-08 DIAGNOSIS — M1712 Unilateral primary osteoarthritis, left knee: Secondary | ICD-10-CM | POA: Diagnosis not present

## 2021-12-08 NOTE — Progress Notes (Signed)
? ? ?  Procedures performed today:   ? ?Procedure: Real-time Ultrasound Guided injection of the left knee ?Device: Samsung HS60  ?Verbal informed consent obtained.  ?Time-out conducted.  ?Noted no overlying erythema, induration, or other signs of local infection.  ?Skin prepped in a sterile fashion.  ?Local anesthesia: Topical Ethyl chloride.  ?With sterile technique and under real time ultrasound guidance: Noted trace effusion, 30 mg/2 mL of OrthoVisc (sodium hyaluronate) in a prefilled syringe was injected easily into the knee through a 22-gauge needle. ?Completed without difficulty  ?Advised to call if fevers/chills, erythema, induration, drainage, or persistent bleeding.  ?Images permanently stored and available for review in PACS.  ?Impression: Technically successful ultrasound guided injection. ? ?Independent interpretation of notes and tests performed by another provider:  ? ?None. ? ?Brief History, Exam, Impression, and Recommendations:   ? ?Primary osteoarthritis of left knee ?Orthovisc 3 of 4 left knee, return in 1 week-ish for #4 of 4. ? ? ? ?___________________________________________ ?Ihor Austin. Benjamin Stain, M.D., ABFM., CAQSM. ?Primary Care and Sports Medicine ?[facility] ? ?Adjunct Instructor of Family Medicine  ?[employer] ?

## 2021-12-08 NOTE — Assessment & Plan Note (Signed)
Orthovisc 3 of 4 left knee, return in 1 week-ish for #4 of 4. ?

## 2021-12-20 ENCOUNTER — Ambulatory Visit: Payer: BC Managed Care – PPO | Admitting: Sports Medicine

## 2021-12-20 ENCOUNTER — Ambulatory Visit (INDEPENDENT_AMBULATORY_CARE_PROVIDER_SITE_OTHER): Payer: BC Managed Care – PPO

## 2021-12-20 DIAGNOSIS — M1712 Unilateral primary osteoarthritis, left knee: Secondary | ICD-10-CM

## 2021-12-20 NOTE — Assessment & Plan Note (Signed)
Orthovisc 4 of 4 left knee, return in 1 month as needed. ?

## 2021-12-20 NOTE — Progress Notes (Signed)
? ? ?  Procedures performed today:   ? ?Procedure: Real-time Ultrasound Guided injection of the left knee ?Device: Samsung HS60  ?Verbal informed consent obtained.  ?Time-out conducted.  ?Noted no overlying erythema, induration, or other signs of local infection.  ?Skin prepped in a sterile fashion.  ?Local anesthesia: Topical Ethyl chloride.  ?With sterile technique and under real time ultrasound guidance: Noted trace effusion, 30 mg/2 mL of OrthoVisc (sodium hyaluronate) in a prefilled syringe was injected easily into the knee through a 22-gauge needle. ?Completed without difficulty  ?Advised to call if fevers/chills, erythema, induration, drainage, or persistent bleeding.  ?Images permanently stored and available for review in PACS.  ?Impression: Technically successful ultrasound guided injection. ? ?Independent interpretation of notes and tests performed by another provider:  ? ?None. ? ?Brief History, Exam, Impression, and Recommendations:   ? ?Primary osteoarthritis of left knee ?Orthovisc 4 of 4 left knee, return in 1 month as needed. ? ? ? ?___________________________________________ ?Ihor Austin. Benjamin Stain, M.D., ABFM., CAQSM. ?Primary Care and Sports Medicine ?[facility] ? ?Adjunct Instructor of Family Medicine  ?[employer] ?

## 2022-01-20 DIAGNOSIS — E6609 Other obesity due to excess calories: Secondary | ICD-10-CM | POA: Diagnosis not present

## 2022-01-20 DIAGNOSIS — G4733 Obstructive sleep apnea (adult) (pediatric): Secondary | ICD-10-CM | POA: Diagnosis not present

## 2022-01-20 DIAGNOSIS — Z6836 Body mass index (BMI) 36.0-36.9, adult: Secondary | ICD-10-CM | POA: Diagnosis not present

## 2022-02-09 DIAGNOSIS — Z6836 Body mass index (BMI) 36.0-36.9, adult: Secondary | ICD-10-CM | POA: Diagnosis not present

## 2022-02-09 DIAGNOSIS — G4733 Obstructive sleep apnea (adult) (pediatric): Secondary | ICD-10-CM | POA: Diagnosis not present

## 2022-02-09 DIAGNOSIS — E785 Hyperlipidemia, unspecified: Secondary | ICD-10-CM | POA: Diagnosis not present

## 2022-02-09 DIAGNOSIS — Z0000 Encounter for general adult medical examination without abnormal findings: Secondary | ICD-10-CM | POA: Diagnosis not present

## 2022-02-09 DIAGNOSIS — B0050 Herpesviral ocular disease, unspecified: Secondary | ICD-10-CM | POA: Diagnosis not present

## 2022-07-22 DIAGNOSIS — H01004 Unspecified blepharitis left upper eyelid: Secondary | ICD-10-CM | POA: Diagnosis not present

## 2022-08-23 DIAGNOSIS — H6501 Acute serous otitis media, right ear: Secondary | ICD-10-CM | POA: Insufficient documentation

## 2022-08-23 DIAGNOSIS — R5383 Other fatigue: Secondary | ICD-10-CM | POA: Diagnosis not present

## 2022-08-23 DIAGNOSIS — Z13228 Encounter for screening for other metabolic disorders: Secondary | ICD-10-CM | POA: Diagnosis not present

## 2022-08-23 DIAGNOSIS — Z1329 Encounter for screening for other suspected endocrine disorder: Secondary | ICD-10-CM | POA: Diagnosis not present

## 2022-08-23 DIAGNOSIS — Z130 Encounter for screening for diseases of the blood and blood-forming organs and certain disorders involving the immune mechanism: Secondary | ICD-10-CM | POA: Diagnosis not present

## 2022-08-23 DIAGNOSIS — E785 Hyperlipidemia, unspecified: Secondary | ICD-10-CM | POA: Diagnosis not present

## 2022-09-28 DIAGNOSIS — J101 Influenza due to other identified influenza virus with other respiratory manifestations: Secondary | ICD-10-CM | POA: Diagnosis not present

## 2022-09-28 DIAGNOSIS — R0981 Nasal congestion: Secondary | ICD-10-CM | POA: Diagnosis not present

## 2022-10-20 DIAGNOSIS — R6882 Decreased libido: Secondary | ICD-10-CM | POA: Diagnosis not present

## 2022-12-01 DIAGNOSIS — E291 Testicular hypofunction: Secondary | ICD-10-CM | POA: Diagnosis not present

## 2022-12-01 DIAGNOSIS — R051 Acute cough: Secondary | ICD-10-CM | POA: Diagnosis not present

## 2023-02-09 DIAGNOSIS — R21 Rash and other nonspecific skin eruption: Secondary | ICD-10-CM | POA: Diagnosis not present

## 2023-05-02 DIAGNOSIS — Z13220 Encounter for screening for lipoid disorders: Secondary | ICD-10-CM | POA: Diagnosis not present

## 2023-05-02 DIAGNOSIS — Z0000 Encounter for general adult medical examination without abnormal findings: Secondary | ICD-10-CM | POA: Diagnosis not present

## 2023-05-02 DIAGNOSIS — R6882 Decreased libido: Secondary | ICD-10-CM | POA: Diagnosis not present

## 2023-09-21 DIAGNOSIS — Z3009 Encounter for other general counseling and advice on contraception: Secondary | ICD-10-CM | POA: Diagnosis not present

## 2023-11-23 DIAGNOSIS — Z302 Encounter for sterilization: Secondary | ICD-10-CM | POA: Diagnosis not present

## 2023-12-19 DIAGNOSIS — G4733 Obstructive sleep apnea (adult) (pediatric): Secondary | ICD-10-CM | POA: Diagnosis not present

## 2023-12-19 DIAGNOSIS — J3089 Other allergic rhinitis: Secondary | ICD-10-CM | POA: Diagnosis not present

## 2023-12-19 DIAGNOSIS — E66813 Obesity, class 3: Secondary | ICD-10-CM | POA: Diagnosis not present

## 2023-12-26 DIAGNOSIS — G4733 Obstructive sleep apnea (adult) (pediatric): Secondary | ICD-10-CM | POA: Diagnosis not present

## 2024-01-25 DIAGNOSIS — G4733 Obstructive sleep apnea (adult) (pediatric): Secondary | ICD-10-CM | POA: Diagnosis not present

## 2024-02-20 DIAGNOSIS — E66812 Obesity, class 2: Secondary | ICD-10-CM | POA: Diagnosis not present

## 2024-02-20 DIAGNOSIS — Z6839 Body mass index (BMI) 39.0-39.9, adult: Secondary | ICD-10-CM | POA: Diagnosis not present

## 2024-02-20 DIAGNOSIS — G4733 Obstructive sleep apnea (adult) (pediatric): Secondary | ICD-10-CM | POA: Diagnosis not present

## 2024-02-25 DIAGNOSIS — G4733 Obstructive sleep apnea (adult) (pediatric): Secondary | ICD-10-CM | POA: Diagnosis not present

## 2024-02-26 DIAGNOSIS — G4733 Obstructive sleep apnea (adult) (pediatric): Secondary | ICD-10-CM | POA: Diagnosis not present

## 2024-03-26 DIAGNOSIS — G4733 Obstructive sleep apnea (adult) (pediatric): Secondary | ICD-10-CM | POA: Diagnosis not present

## 2024-04-04 DIAGNOSIS — B001 Herpesviral vesicular dermatitis: Secondary | ICD-10-CM | POA: Diagnosis not present

## 2024-04-11 ENCOUNTER — Ambulatory Visit: Admitting: Podiatry

## 2024-04-11 ENCOUNTER — Encounter: Payer: Self-pay | Admitting: Podiatry

## 2024-04-11 ENCOUNTER — Ambulatory Visit

## 2024-04-11 DIAGNOSIS — M79672 Pain in left foot: Secondary | ICD-10-CM | POA: Diagnosis not present

## 2024-04-11 DIAGNOSIS — M722 Plantar fascial fibromatosis: Secondary | ICD-10-CM | POA: Diagnosis not present

## 2024-04-11 DIAGNOSIS — M7732 Calcaneal spur, left foot: Secondary | ICD-10-CM | POA: Diagnosis not present

## 2024-04-11 DIAGNOSIS — B001 Herpesviral vesicular dermatitis: Secondary | ICD-10-CM | POA: Insufficient documentation

## 2024-04-11 MED ORDER — MELOXICAM 15 MG PO TABS: 15.0000 mg | ORAL_TABLET | Freq: Every day | ORAL | 0 refills | Status: AC

## 2024-04-11 NOTE — Patient Instructions (Signed)

## 2024-04-11 NOTE — Progress Notes (Signed)
  Subjective:  Patient ID: Brandon Newman, male    DOB: 24-Feb-1986,   MRN: [id number]  Chief Complaint  Patient presents with   Foot Pain    Plantar heel left - aching x few weeks, AM pain, no injury, pain starting to get sharp, no treatment   New Patient (Initial Visit)    38 y.o. male presents for concern of left heel pain that has been ongoing for a few weeks. Relates pain in the bottom of his heel and mostly with first steps in the morning. Denies any injuries. Denies any current treatments.    . Denies any other pedal complaints. Denies n/v/f/c.   No past medical history on file.  Objective:  Physical Exam: Vascular: DP/PT pulses 2/4 bilateral. CFT <3 seconds. Normal hair growth on digits. No edema.  Skin. No lacerations or abrasions bilateral feet.  Musculoskeletal: MMT 5/5 bilateral lower extremities in DF, PF, Inversion and Eversion. Deceased ROM in DF of ankle joint. Tender to the medial calcaneal tubercle left . No pain with achilles, PT or arch. No pain with calcaneal squeeze.  Neurological: Sensation intact to light touch.   Assessment:   1. Plantar fasciitis of left foot      Plan:  Patient was evaluated and treated and all questions answered. Discussed plantar fasciitis with patient.  X-rays reviewed and discussed with patient. No acute fractures or dislocations noted. Mild spurring noted at inferior calcaneus.  Discussed treatment options including, ice, NSAIDS, supportive shoes, bracing, and stretching. Stretching exercises provided to be done on a daily basis.   Prescription for meloxicam  provided and sent to pharmacy. PF brace dispensed.  Discussed CMO.  Follow-up 6 weeks or sooner if any problems arise. In the meantime, encouraged to call the office with any questions, concerns, change in symptoms.     Asberry Failing, DPM

## 2024-04-26 DIAGNOSIS — G4733 Obstructive sleep apnea (adult) (pediatric): Secondary | ICD-10-CM | POA: Diagnosis not present

## 2024-05-06 DIAGNOSIS — Z0000 Encounter for general adult medical examination without abnormal findings: Secondary | ICD-10-CM | POA: Diagnosis not present

## 2024-05-14 ENCOUNTER — Encounter: Payer: Self-pay | Admitting: Sports Medicine

## 2024-05-23 ENCOUNTER — Ambulatory Visit: Admitting: Podiatry

## 2024-06-26 DIAGNOSIS — G4733 Obstructive sleep apnea (adult) (pediatric): Secondary | ICD-10-CM | POA: Diagnosis not present

## 2024-08-25 DIAGNOSIS — J019 Acute sinusitis, unspecified: Secondary | ICD-10-CM | POA: Diagnosis not present

## 2024-09-09 DIAGNOSIS — R0981 Nasal congestion: Secondary | ICD-10-CM | POA: Diagnosis not present

## 2024-09-09 DIAGNOSIS — J111 Influenza due to unidentified influenza virus with other respiratory manifestations: Secondary | ICD-10-CM | POA: Diagnosis not present
